# Patient Record
Sex: Female | Born: 1950 | Race: Asian | Hispanic: No | Marital: Married | Smoking: Never smoker
Health system: Southern US, Community
[De-identification: ages and names within clinical notes are randomized; demographics above are authoritative.]

## PROBLEM LIST (undated history)

## (undated) DIAGNOSIS — I10 Essential (primary) hypertension: Secondary | ICD-10-CM

## (undated) HISTORY — PX: KNEE SURGERY: SHX244

---

## 2018-06-13 ENCOUNTER — Encounter (HOSPITAL_COMMUNITY): Payer: Self-pay

## 2018-06-13 ENCOUNTER — Other Ambulatory Visit: Payer: Self-pay

## 2018-06-13 ENCOUNTER — Emergency Department (HOSPITAL_COMMUNITY)
Admission: EM | Admit: 2018-06-13 | Discharge: 2018-06-13 | Disposition: A | Discharge status: Home / Self Care | Payer: Self-pay | Attending: Emergency Medicine | Admitting: Emergency Medicine

## 2018-06-13 ENCOUNTER — Emergency Department (HOSPITAL_COMMUNITY): Payer: Self-pay

## 2018-06-13 DIAGNOSIS — R202 Paresthesia of skin: Secondary | ICD-10-CM | POA: Insufficient documentation

## 2018-06-13 DIAGNOSIS — Z79899 Other long term (current) drug therapy: Secondary | ICD-10-CM | POA: Insufficient documentation

## 2018-06-13 DIAGNOSIS — R110 Nausea: Secondary | ICD-10-CM | POA: Insufficient documentation

## 2018-06-13 DIAGNOSIS — I160 Hypertensive urgency: Secondary | ICD-10-CM | POA: Insufficient documentation

## 2018-06-13 HISTORY — DX: Essential (primary) hypertension: I10

## 2018-06-13 LAB — COMPREHENSIVE METABOLIC PANEL
ALBUMIN: 4 g/dL (ref 3.5–5.0)
ALT: 18 U/L (ref 0–44)
AST: 19 U/L (ref 15–41)
Alkaline Phosphatase: 71 U/L (ref 38–126)
Anion gap: 12 (ref 5–15)
BUN: 21 mg/dL (ref 8–23)
CHLORIDE: 107 mmol/L (ref 98–111)
CO2: 24 mmol/L (ref 22–32)
Calcium: 9.3 mg/dL (ref 8.9–10.3)
Creatinine, Ser: 0.75 mg/dL (ref 0.44–1.00)
GFR calc Af Amer: >60 mL/min (ref >60)
Glucose, Bld: 157 mg/dL — ABNORMAL HIGH (ref 70–99)
POTASSIUM: 3.7 mmol/L (ref 3.5–5.1)
SODIUM: 143 mmol/L (ref 135–145)
Total Bilirubin: 0.5 mg/dL (ref 0.3–1.2)
Total Protein: 6.9 g/dL (ref 6.5–8.1)

## 2018-06-13 LAB — CBC WITH DIFFERENTIAL/PLATELET
Basophils Absolute: 0.1 10*3/uL (ref 0.0–0.1)
Basophils Relative: 1 %
EOS PCT: 5 %
Eosinophils Absolute: 0.3 10*3/uL (ref 0.0–0.7)
HCT: 47 % — ABNORMAL HIGH (ref 36.0–46.0)
HEMOGLOBIN: 15.5 g/dL — AB (ref 12.0–15.0)
LYMPHS PCT: 29 %
Lymphs Abs: 1.8 10*3/uL (ref 0.7–4.0)
MCH: 30.2 pg (ref 26.0–34.0)
MCHC: 33 g/dL (ref 30.0–36.0)
MCV: 91.6 fL (ref 78.0–100.0)
MONOS PCT: 9 %
Monocytes Absolute: 0.6 10*3/uL (ref 0.1–1.0)
Neutro Abs: 3.6 10*3/uL (ref 1.7–7.7)
Neutrophils Relative %: 56 %
Platelets: 186 10*3/uL (ref 150–400)
RBC: 5.13 MIL/uL — AB (ref 3.87–5.11)
RDW: 12.8 % (ref 11.5–15.5)
WBC: 6.3 10*3/uL (ref 4.0–10.5)

## 2018-06-13 LAB — URINALYSIS, COMPLETE (UACMP) WITH MICROSCOPIC
BILIRUBIN URINE: NEGATIVE
Bacteria, UA: NONE SEEN
GLUCOSE, UA: NEGATIVE mg/dL
HGB URINE DIPSTICK: NEGATIVE
KETONES UR: NEGATIVE mg/dL
LEUKOCYTES UA: NEGATIVE
NITRITE: NEGATIVE
PROTEIN: NEGATIVE mg/dL
Specific Gravity, Urine: 1.009 (ref 1.005–1.030)
pH: 7 (ref 5.0–8.0)

## 2018-06-13 LAB — CBG MONITORING, ED: GLUCOSE-CAPILLARY: 152 mg/dL — AB (ref 70–99)

## 2018-06-13 MED ORDER — HYDROCHLOROTHIAZIDE 25 MG PO TABS: 25.0000 mg | ORAL_TABLET | Freq: Every day | ORAL | 0 refills | Status: AC

## 2018-06-13 MED ORDER — AMLODIPINE BESYLATE 5 MG PO TABS: 5.0000 mg | ORAL_TABLET | Freq: Every day | ORAL | 0 refills | Status: AC

## 2018-06-13 MED ORDER — HYDRALAZINE HCL 20 MG/ML IJ SOLN
10.0000 mg | INTRAMUSCULAR | Status: AC
  Administered 2018-06-13: 10 mg via INTRAVENOUS
  Filled 2018-06-13: qty 1

## 2018-06-13 MED ORDER — SODIUM CHLORIDE 0.9 % IV BOLUS
1000.0000 mL | Freq: Once | INTRAVENOUS | Status: AC
  Administered 2018-06-13: 1000 mL via INTRAVENOUS

## 2018-06-13 MED ORDER — SODIUM CHLORIDE 0.9 % IV SOLN
INTRAVENOUS | Status: DC
  Administered 2018-06-13: 22:00:00 via INTRAVENOUS

## 2018-06-13 MED ORDER — LABETALOL HCL 5 MG/ML IV SOLN: 10.0000 mg | INTRAVENOUS | Status: DC | PRN

## 2018-06-13 MED ORDER — PROCHLORPERAZINE EDISYLATE 10 MG/2ML IJ SOLN
5.0000 mg | Freq: Once | INTRAMUSCULAR | Status: AC
  Administered 2018-06-13: 5 mg via INTRAVENOUS
  Filled 2018-06-13: qty 2

## 2018-06-13 NOTE — ED Provider Notes (Signed)
[facility]-EMERGENCY DEPT Provider Note   CSN: [id number] Arrival date & time: 06/13/18  1649     History   Chief Complaint Chief Complaint  Patient presents with  . Chills  . Hypertension  . Hyperglycemia  . Headache    HPI [place] is a 67 y.o. female.  HPI Presents with her daughter who assists with the HPI, and translation. Patient has a preference for this. Patient has history of hypertension, has had episodes of headache in the past, does not have daily headaches, however. Patient awoke this morning, after being in her usual state of health yesterday, but today had earache, nausea, tingling. Since onset symptoms been persistent, with a frontal pressure-like headache. There are no new vision changes, no focal weakness, no confusion. She has had nausea, without vomiting. No medication taken for pain relief. Patient notes that she is compliant with her medication regimen of amlodipine for blood pressure control. Past Medical History:  Diagnosis Date  . Hypertension     There are no active problems to display for this patient.   Past Surgical History:  Procedure Laterality Date  . KNEE SURGERY       OB History   None      Home Medications    Prior to Admission medications   Medication Sig Start Date End Date Taking? Authorizing Provider  AMLODIPINE BENZOATE PO Take 1 tablet by mouth daily.   Yes [provider]  GLIPIZIDE PO Take 1 tablet by mouth daily. Suppose to take TID   Yes [provider]  METFORMIN HCL PO Take 1 tablet by mouth daily.   Yes [provider]  PRESCRIPTION MEDICATION Take 1 tablet by mouth daily as needed (knee pain). Arcoxia (etoricoxib) 60mg . Patient get [place]   Yes [provider]    Family History Family History  Problem Relation Age of Onset  . Diabetes Mother     Social History Social History   Tobacco Use  . Smoking status: Never Smoker  .  Smokeless tobacco: Never Used  Substance Use Topics  . Alcohol use: Never    Frequency: Never  . Drug use: Never     Allergies   Patient has no known allergies.   Review of Systems Review of Systems  Constitutional:       Per HPI, otherwise negative  HENT:       Per HPI, otherwise negative  Respiratory:       Per HPI, otherwise negative  Cardiovascular:       Per HPI, otherwise negative  Gastrointestinal: Positive for nausea. Negative for vomiting.  Endocrine:       Negative aside from HPI  Genitourinary:       Neg aside from HPI   Musculoskeletal:       Per HPI, otherwise negative  Skin: Negative.   Neurological: Positive for headaches. Negative for syncope.     Physical Exam Updated Vital Signs BP (!) 156/76   Pulse 86   Temp 98 F (36.7 C) (Oral)   Resp (!) 23   Ht 5\' 2"  (1.575 m)   Wt 75 kg   SpO2 96%   BMI 30.24 kg/m   Physical Exam  Constitutional: She is oriented to person, place, and time. She appears well-developed and well-nourished. No distress.  HENT:  Head: Normocephalic and atraumatic.  Eyes: Conjunctivae and EOM are normal. Right pupil is reactive. Left pupil is reactive.  Cardiovascular: Normal rate and regular rhythm.  Pulmonary/Chest: Effort  normal and breath sounds normal. No stridor. No respiratory distress.  Abdominal: She exhibits no distension.  Musculoskeletal: She exhibits no edema.  Neurological: She is alert and oriented to person, place, and time. She displays no atrophy and no tremor. No cranial nerve deficit. She exhibits normal muscle tone. She displays no seizure activity.  Skin: Skin is warm and dry.  Psychiatric: She has a normal mood and affect.  Nursing note and vitals reviewed.    ED Treatments / Results  Labs (all labs ordered are listed, but only abnormal results are displayed) Labs Reviewed  COMPREHENSIVE METABOLIC PANEL - Abnormal; Notable for the following components:      Result Value   Glucose, Bld 157  (*)    All other components within normal limits  CBC WITH DIFFERENTIAL/PLATELET - Abnormal; Notable for the following components:   RBC 5.13 (*)    Hemoglobin 15.5 (*)    HCT 47.0 (*)    All other components within normal limits  URINALYSIS, COMPLETE (UACMP) WITH MICROSCOPIC - Abnormal; Notable for the following components:   Color, Urine STRAW (*)    All other components within normal limits  CBG MONITORING, ED - Abnormal; Notable for the following components:   Glucose-Capillary 152 (*)    All other components within normal limits  CBG MONITORING, ED    EKG EKG Interpretation  Date/Time:  Friday June 13 2018 17:28:52 EDT Ventricular Rate:  73 PR Interval:    QRS Duration: 82 QT Interval:  437 QTC Calculation: 482 R Axis:   21 Text Interpretation:  Sinus rhythm Abnormal R-wave progression, early transition Baseline wander in lead(s) V4 Abnormal ekg Confirmed by Gerhard Munch [phone]) on 06/13/2018 5:33:39 PM   Radiology Dg Chest Port 1 View  Result Date: 06/13/2018 CLINICAL DATA:  Headaches, chills EXAM: PORTABLE CHEST 1 VIEW COMPARISON:  None. FINDINGS: The heart size and mediastinal contours are within normal limits. Both lungs are clear. The visualized skeletal structures are unremarkable. IMPRESSION: No active disease. Electronically Signed   By: Elige Ko   On: 06/13/2018 18:15    Procedures Procedures (including critical care time)  Medications Ordered in ED Medications  sodium chloride 0.9 % bolus 1,000 mL (1,000 mLs Intravenous New Bag/Given 06/13/18 1803)    And  0.9 %  sodium chloride infusion (has no administration in time range)  labetalol (NORMODYNE,TRANDATE) injection 10 mg (has no administration in time range)  hydrALAZINE (APRESOLINE) injection 10 mg (10 mg Intravenous Given 06/13/18 1800)  prochlorperazine (COMPAZINE) injection 5 mg (5 mg Intravenous Given 06/13/18 1806)     Initial Impression / Assessment and Plan / ED Course  I have reviewed  the triage vital signs and the nursing notes.  Pertinent labs & imaging results that were available during my care of the patient were reviewed by me and considered in my medical decision making (see chart for details).     9:33 PM On repeat exam the patient's blood pressure has returned to presentation level, after transiently decreasing following hydralazine. Patient notes that her headache is still severe, right-sided in spite of initial fluids, Compazine. She also still has subjective tingling in her arms. With this change, the patient will receive labetalol, head CT ordered Labs generally reassuring. 11:14 PM Patient calm, in no distress, blood pressure continues to improve. Is not clear the patient has not indeed, been taking her regular blood pressure medication. With no evidence for intracranial pathology, no evidence for endorgan effects consisting hypertension, general improvement, the patient was  discharged after long conversation with the patient's family members. Patient will restart amlodipine, start additional hydrochlorothiazide, follow-up with primary care.   Final Clinical Impressions(s) / ED Diagnoses  Hypertensive urgency   Gerhard Munch, MD 06/13/18 2314

## 2018-06-13 NOTE — Discharge Instructions (Addendum)
As discussed, your evaluation today has been largely reassuring.  But, it is important that you monitor your condition carefully, and do not hesitate to return to the ED if you develop new, or concerning changes in your condition. ? ?Otherwise, please follow-up with your physician for appropriate ongoing care. ? ?

## 2018-06-13 NOTE — ED Notes (Signed)
CBG 152  

## 2018-06-13 NOTE — ED Triage Notes (Signed)
Patient c/o headache, chills, hyperglycemia, and hypertension since this AM. Patient's daughter reports that patient is sensitive to light and has nausea.

## 2019-10-16 IMAGING — CT CT HEAD W/O CM
3 series · 15 of 47 positions shown, 18 images · non-contrast
Comparison: None.

CLINICAL DATA: Headache and chills

EXAM:
CT HEAD WITHOUT CONTRAST
TECHNIQUE: Contiguous axial images were obtained from the base of the skull
through the vertex without intravenous contrast.

[Series 2: head wo · axial · 0.43mm/px · z∈[-168,-43]mm · 9 of 30 slices shown, 12 images]
[im 3/30  brain]
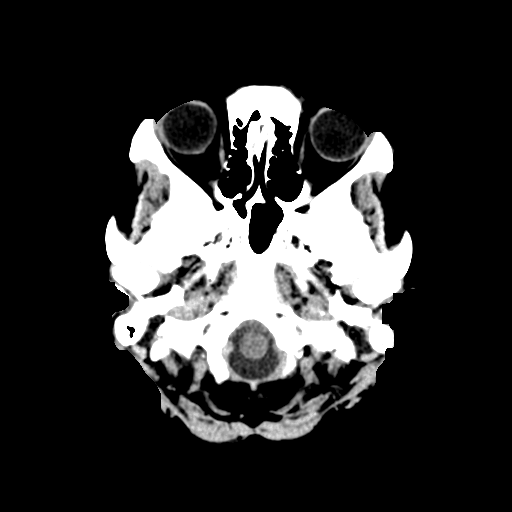
[im 3/30  bone]
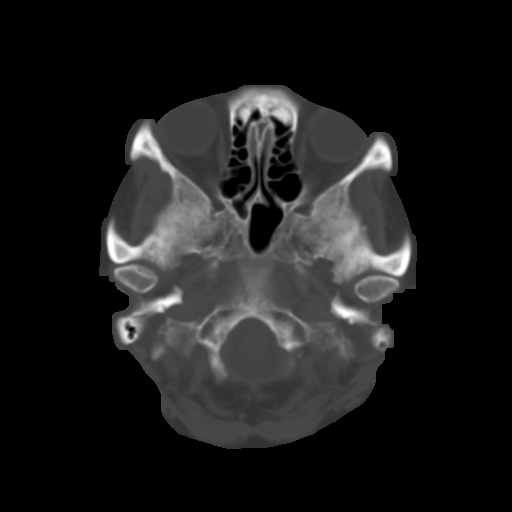
[im 6/30  brain]
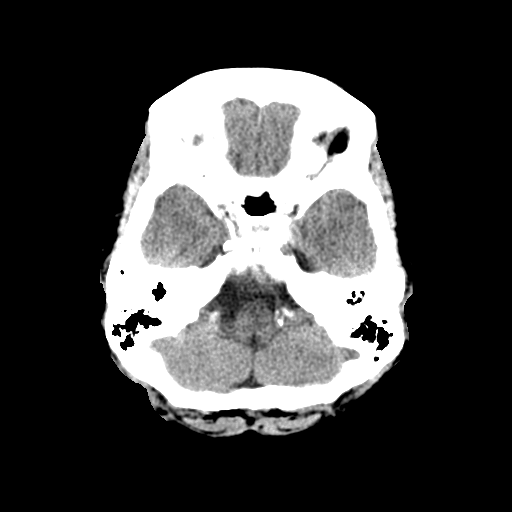
[im 9/30  brain]
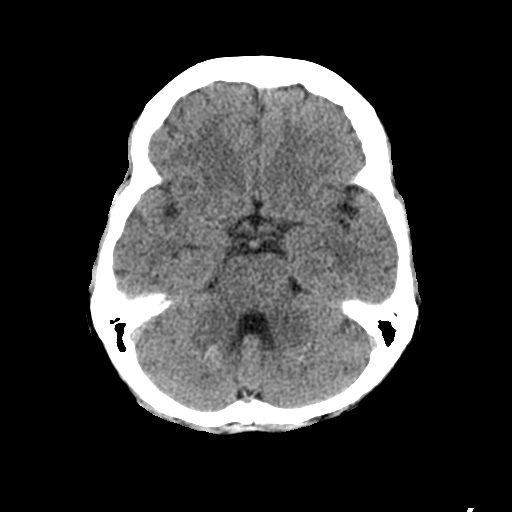
[im 12/30  brain]
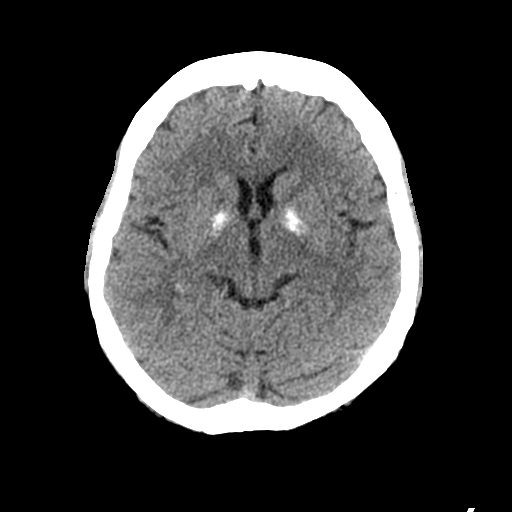
[im 16/30  brain]
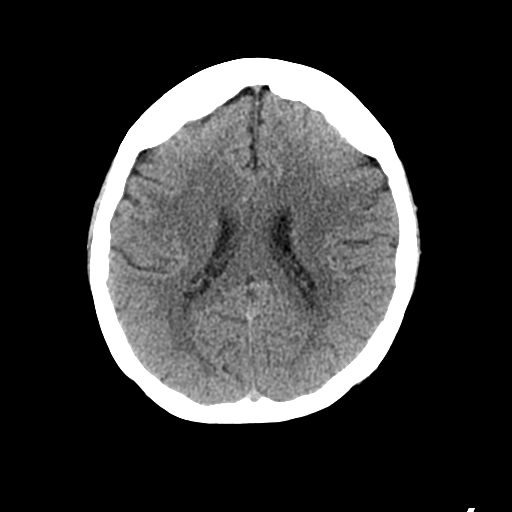
[im 16/30  bone]
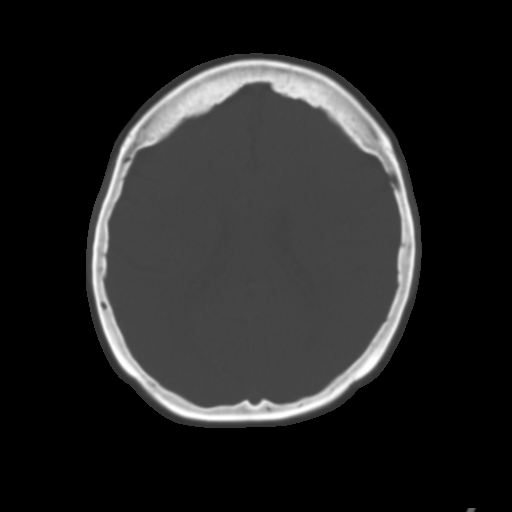
[im 19/30  brain]
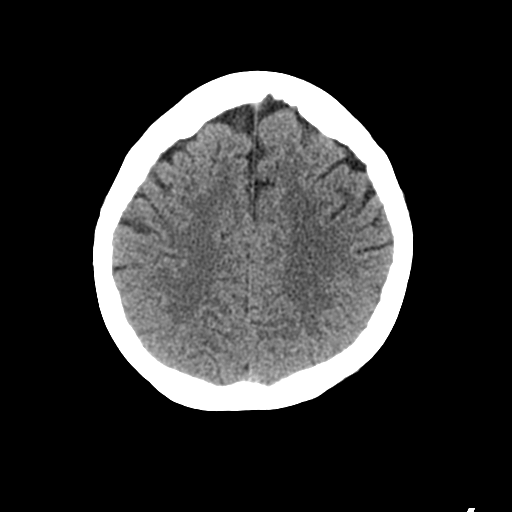
[im 22/30  brain]
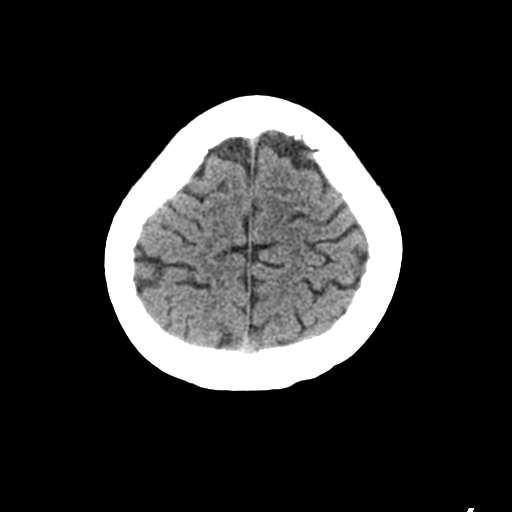
[im 25/30  brain]
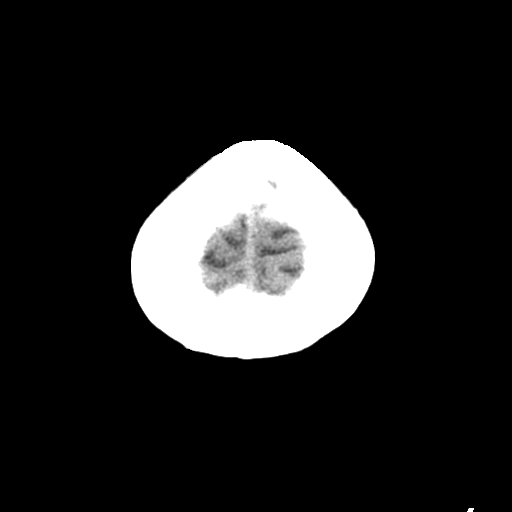
[im 28/30  brain]
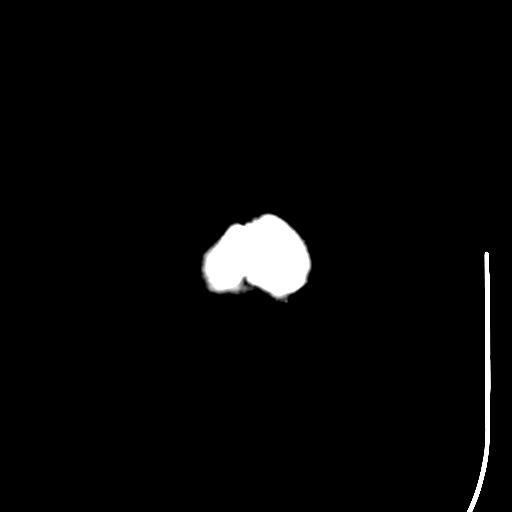
[im 28/30  bone]
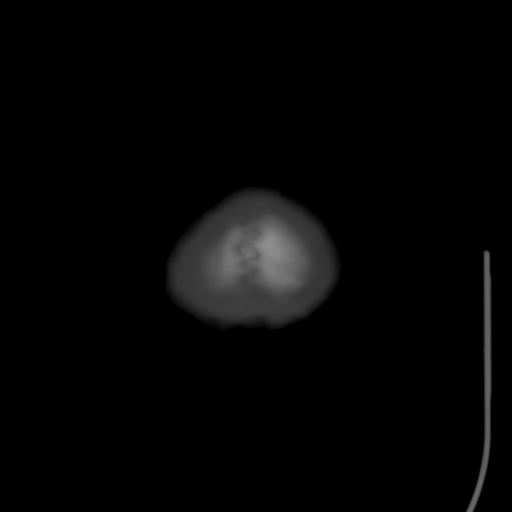

[Series 5: coronal soft tissue · coronal · 0.29mm/px · 3 of 62 slices shown]
[im 21/62  brain]
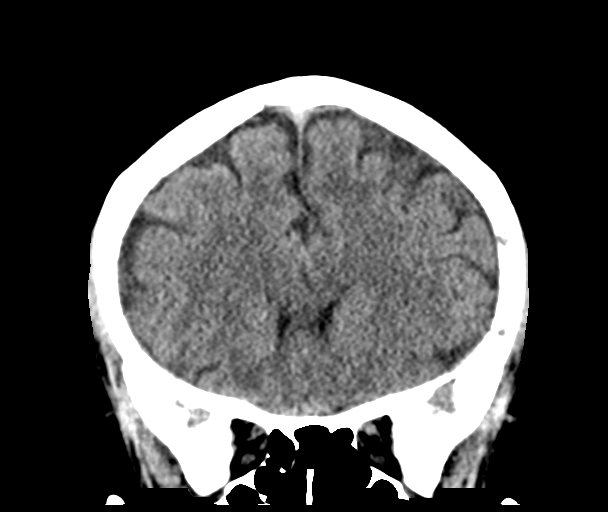
[im 28/62  brain]
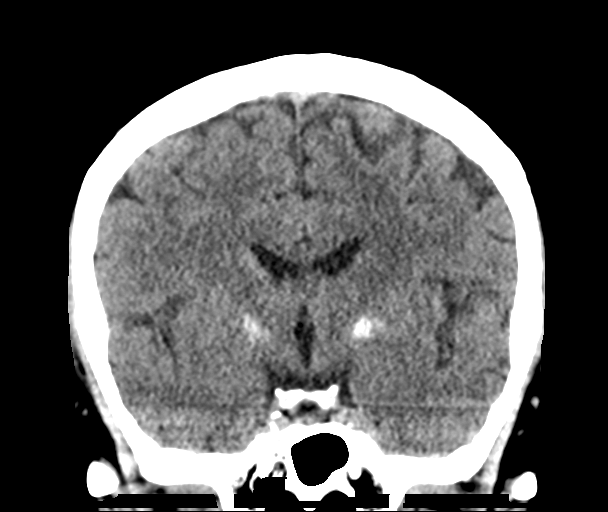
[im 34/62  brain]
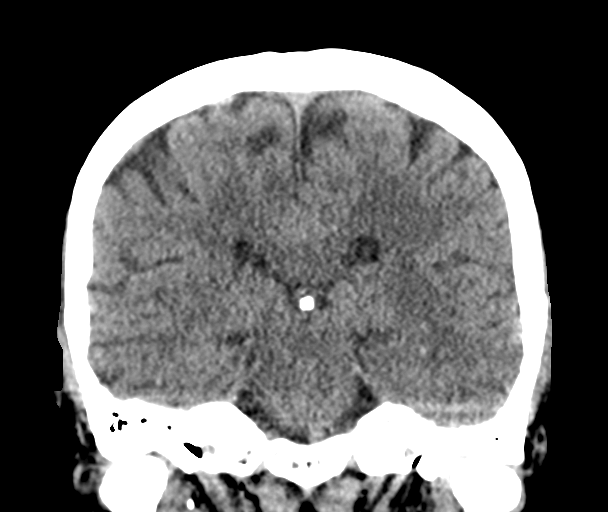

[Series 6: sagittal soft tissue · sagittal · 0.29mm/px · 3 of 55 slices shown]
[im 19/55  brain]
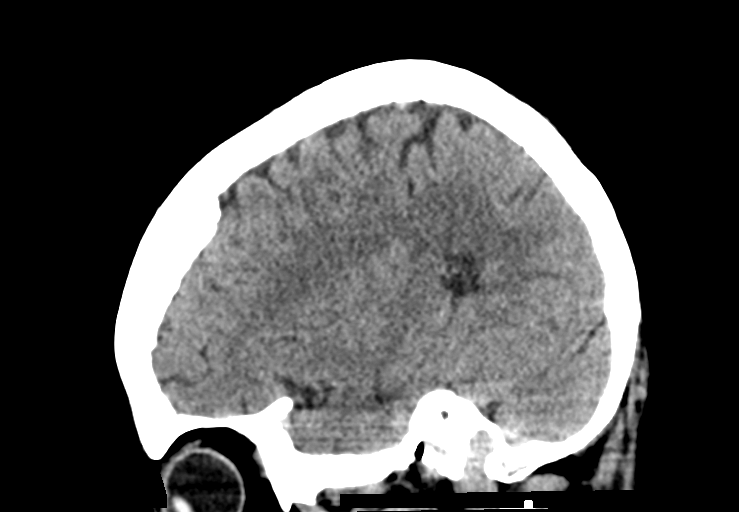
[im 28/55  brain]
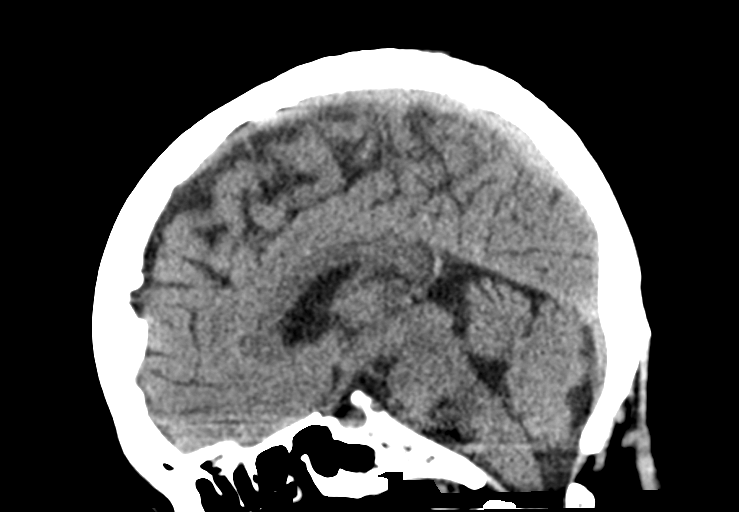
[im 37/55  brain]
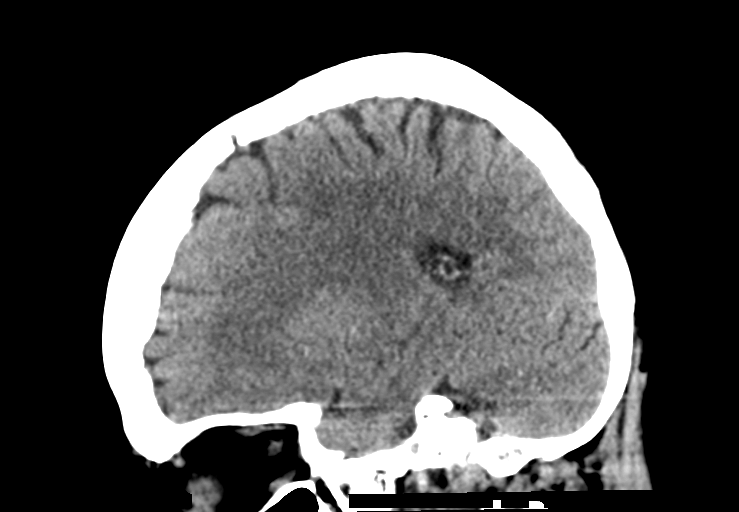

[15 of 47 positions shown; findings below may reference images not displayed]

FINDINGS: BRAIN: The ventricles and sulci are normal. No intraparenchymal
hemorrhage, mass effect nor midline shift. No acute large vascular
territory infarcts. Grey-white matter distinction is maintained.
Idiopathic bilateral basal ganglial calcifications likely age
related. No abnormal extra-axial fluid collections. Basal cisterns
are not effaced and midline. The brainstem and cerebellar
hemispheres are without acute abnormalities.

VASCULAR: Unremarkable.

SKULL/SOFT TISSUES: No skull fracture. No significant soft tissue
swelling.

ORBITS/SINUSES: The included ocular globes and orbital contents are
normal.The mastoid air cells are clear. The included paranasal
sinuses are well-aerated.

OTHER: None.
IMPRESSION: No acute intracranial abnormality.
